# Patient Record
Sex: Male | Born: 1935 | Race: White | Hispanic: No | Marital: Married | Smoking: Never smoker
Health system: Southern US, Community
[De-identification: ages and names within clinical notes are randomized; demographics above are authoritative.]

---

## 2009-02-12 ENCOUNTER — Encounter: Admission: RE | Admit: 2009-02-12 | Discharge: 2009-02-12 | Payer: Self-pay | Admitting: Chiropractic Medicine

## 2010-04-27 IMAGING — CR DG KNEE COMPLETE 4+V*L*
5 series · 5 of 5 positions shown · non-contrast
Comparison: None

CLINICAL DATA: Pain, no recent injury

LEFT KNEE - COMPLETE 4+ VIEW

[view not recorded (1 of 5)]
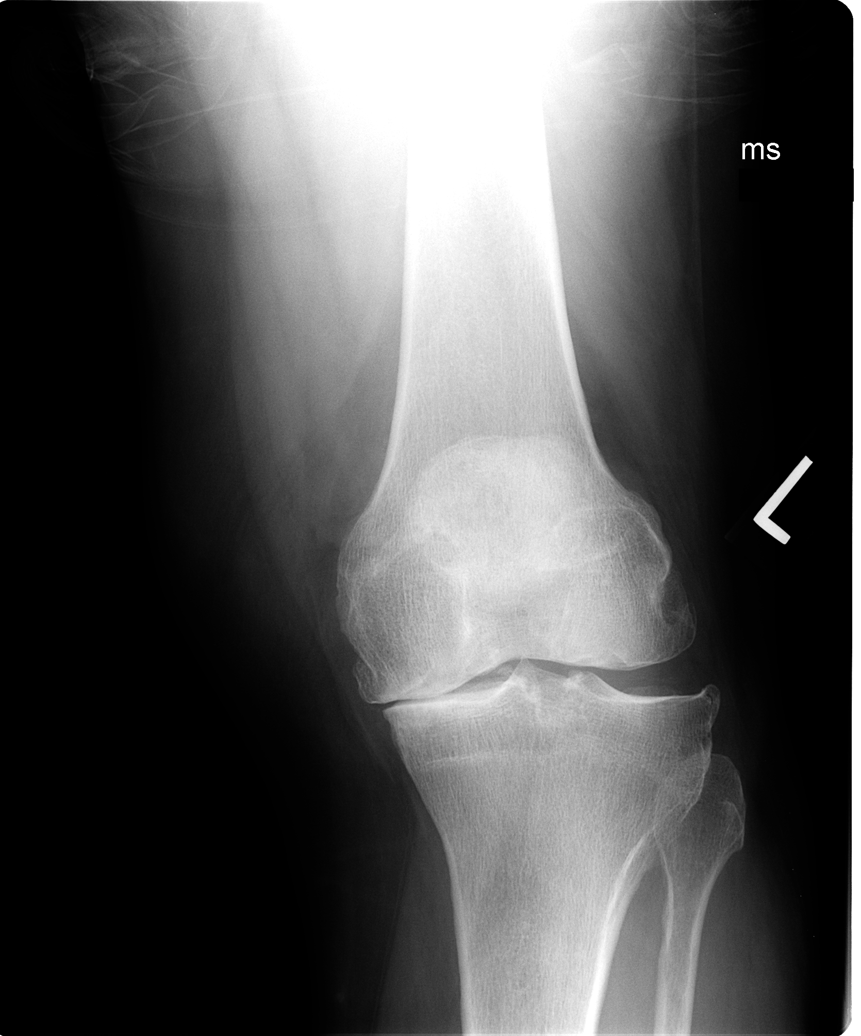

[view not recorded (2 of 5)]
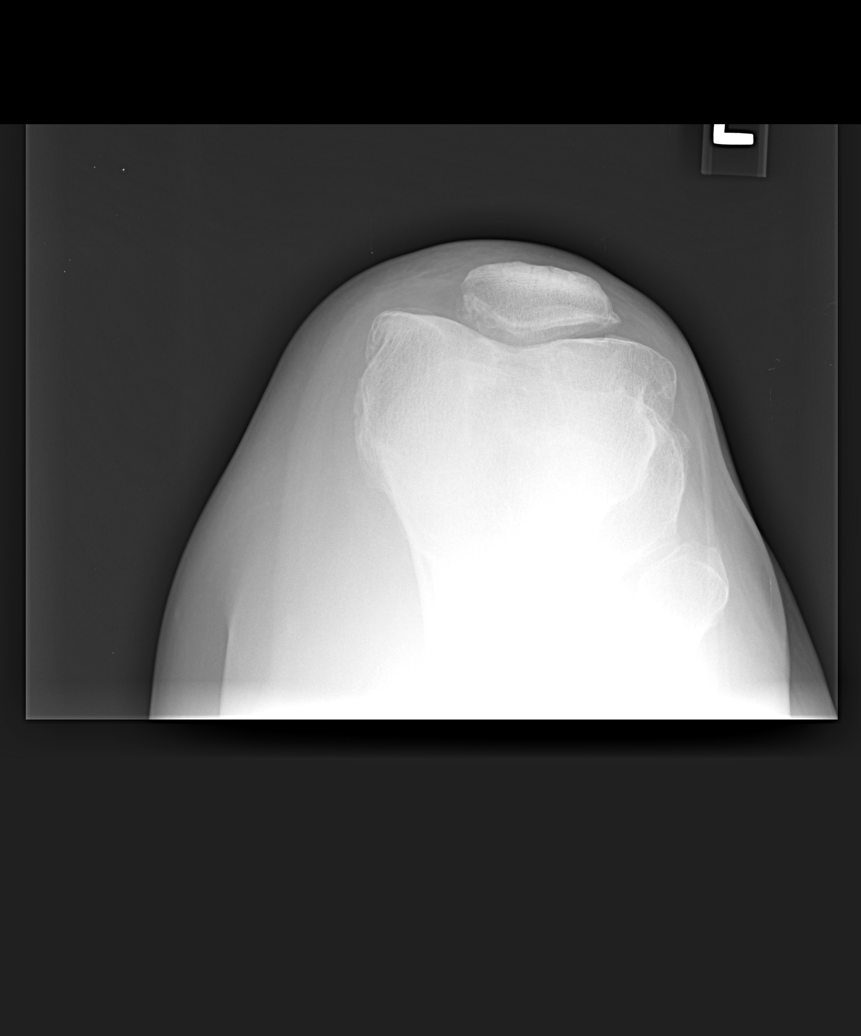

[view not recorded (3 of 5)]
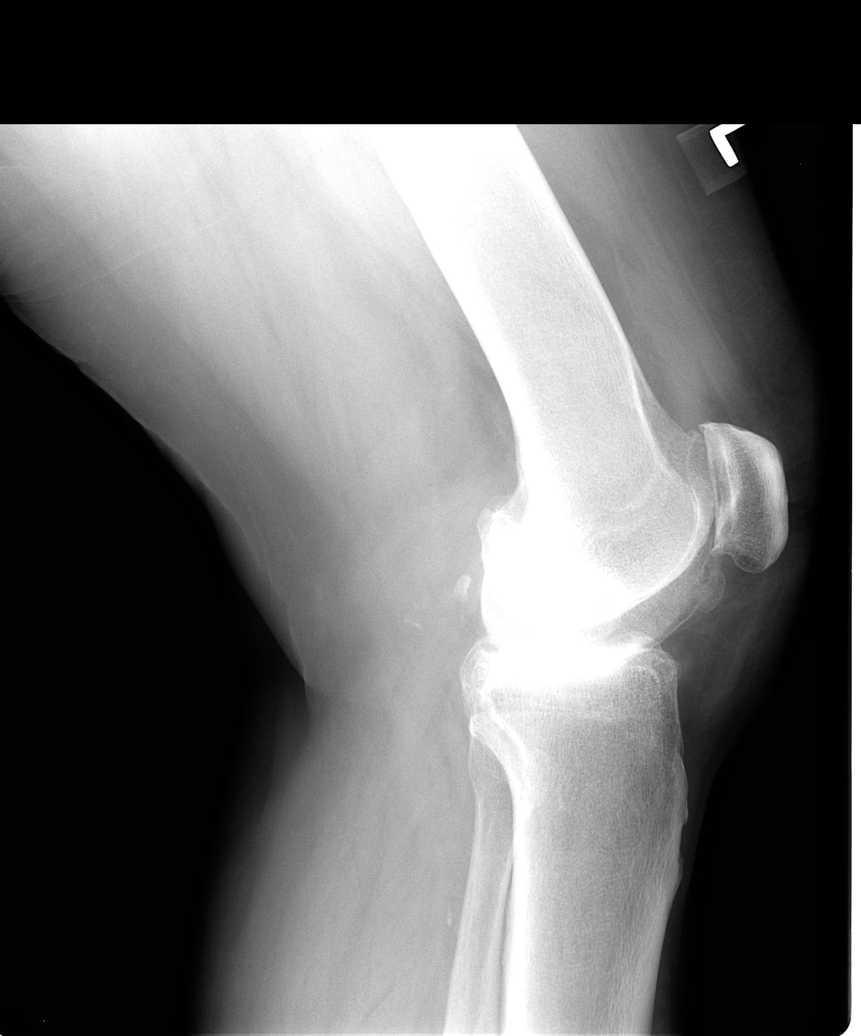

[view not recorded (4 of 5)]
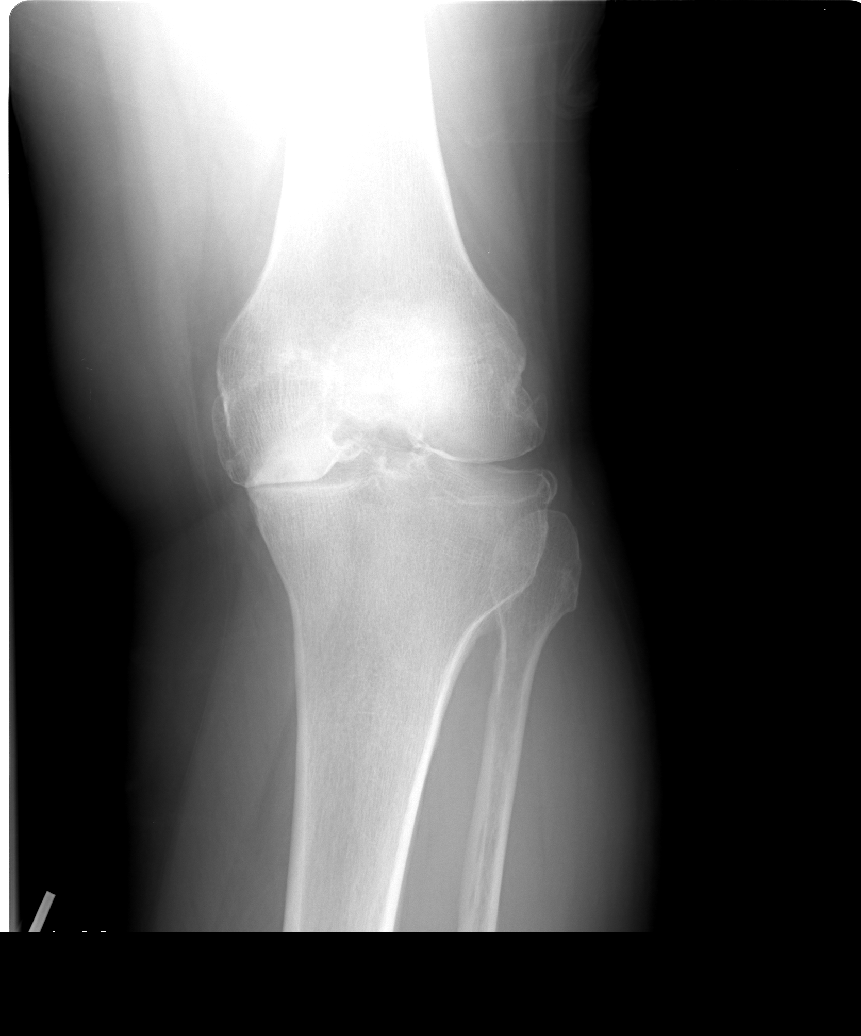

[view not recorded (5 of 5)]
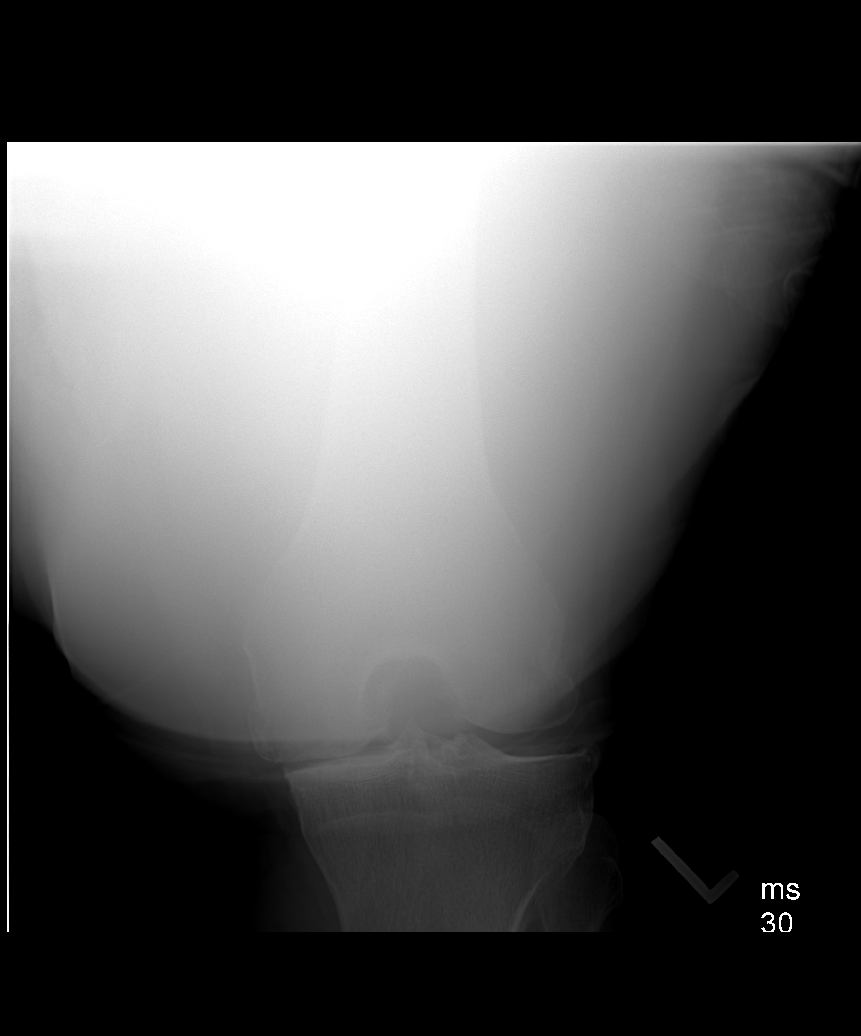

[5 of 5 positions shown; findings below may reference images not displayed]

FINDINGS: There is considerable loss of medial joint space with
sclerosis and spurring.  There also appears to be a small left knee
joint effusion present.  The patella is normally positioned with
the within the patellofemoral groove. The frontal view there may be
a small loose body just medial to the medial tibial spine.
IMPRESSION: Considerable degenerative change medially.  Small effusion.  Cannot
exclude small loose body.

## 2012-11-02 ENCOUNTER — Ambulatory Visit (INDEPENDENT_AMBULATORY_CARE_PROVIDER_SITE_OTHER): Payer: Self-pay | Admitting: Sports Medicine

## 2012-11-02 ENCOUNTER — Encounter: Payer: Self-pay | Admitting: Sports Medicine

## 2012-11-02 VITALS — BP 146/74 | HR 54 | Wt 203.0 lb

## 2012-11-02 DIAGNOSIS — M1710 Unilateral primary osteoarthritis, unspecified knee: Secondary | ICD-10-CM

## 2012-11-02 DIAGNOSIS — IMO0002 Reserved for concepts with insufficient information to code with codable children: Secondary | ICD-10-CM

## 2012-11-02 DIAGNOSIS — M1712 Unilateral primary osteoarthritis, left knee: Secondary | ICD-10-CM

## 2012-11-02 DIAGNOSIS — M170 Bilateral primary osteoarthritis of knee: Secondary | ICD-10-CM | POA: Insufficient documentation

## 2012-11-02 NOTE — Progress Notes (Signed)
  Subjective:    CC: Left knee pain  HPI:  This is a very pleasant 77 year old male who has a known history of left knee osteoarthritis, last injection was approximately 5 years ago and gave her tremendous response. I treated his wife with knee injections and she's doing very well, he desires interventional treatment. Pain is localized to the medial joint line, moderate, persistent, does not radiate, no mechanical symptoms.  Past medical history, Surgical history, Family history not pertinant except as noted below, Social history, Allergies, and medications have been entered into the medical record, reviewed, and no changes needed.   Review of Systems: No headache, visual changes, nausea, vomiting, diarrhea, constipation, dizziness, abdominal pain, skin rash, fevers, chills, night sweats, swollen lymph nodes, weight loss, chest pain, body aches, joint swelling, muscle aches, shortness of breath, mood changes, visual or auditory hallucinations.  Objective:    General: Well Developed, well nourished, and in no acute distress.  Neuro: Alert and oriented x3, extra-ocular muscles intact, sensation grossly intact.  HEENT: Normocephalic, atraumatic, pupils equal round reactive to light, neck supple, no masses, no lymphadenopathy, thyroid nonpalpable.  Skin: Warm and dry, no rashes noted.  Cardiac: Regular rate and rhythm, no murmurs rubs or gallops.  Respiratory: Clear to auscultation bilaterally. Not using accessory muscles, speaking in full sentences.  Abdominal: Soft, nontender, nondistended, positive bowel sounds, no masses, no organomegaly.  Left Knee: Normal to inspection with no erythema or effusion or obvious bony abnormalities. Tender to palpation over medial joint line ROM full in flexion and extension and lower leg rotation. Ligaments with solid consistent endpoints including ACL, PCL, LCL, MCL. Negative Mcmurray's, Apley's, and Thessalonian tests. Non painful patellar  compression. Patellar glide without crepitus. Patellar and quadriceps tendons unremarkable. Hamstring and quadriceps strength is normal.   Procedure: Real-time Ultrasound Guided Injection of left knee Device: GE Logiq E  Verbal informed consent obtained.  Time-out conducted.  Noted no overlying erythema, induration, or other signs of local infection.  Skin prepped in a sterile fashion.  Local anesthesia: Topical Ethyl chloride.  With sterile technique and under real time ultrasound guidance:  2 cc Kenalog 40, 4 cc lidocaine injected easily into the suprapatellar recess. Completed without difficulty  Pain immediately resolved suggesting accurate placement of the medication.  Advised to call if fevers/chills, erythema, induration, drainage, or persistent bleeding.  Images permanently stored and available for review in the ultrasound unit.  Impression: Technically successful ultrasound guided injection.  Impression and Recommendations:    The patient was counselled, risk factors were discussed, anticipatory guidance given.

## 2012-11-02 NOTE — Assessment & Plan Note (Signed)
Fantastic response to in particular injection 5 years ago. Repeat injection today. He will do home exercises with his wife, she has the same pathology.

## 2012-11-23 ENCOUNTER — Ambulatory Visit (INDEPENDENT_AMBULATORY_CARE_PROVIDER_SITE_OTHER): Payer: Medicare Other | Admitting: Sports Medicine

## 2012-11-23 ENCOUNTER — Ambulatory Visit: Payer: Medicare Other | Admitting: Family Medicine

## 2012-11-23 ENCOUNTER — Encounter: Payer: Self-pay | Admitting: Sports Medicine

## 2012-11-23 VITALS — BP 120/72 | HR 61 | Wt 206.0 lb

## 2012-11-23 DIAGNOSIS — M1710 Unilateral primary osteoarthritis, unspecified knee: Secondary | ICD-10-CM

## 2012-11-23 DIAGNOSIS — IMO0002 Reserved for concepts with insufficient information to code with codable children: Secondary | ICD-10-CM

## 2012-11-23 DIAGNOSIS — M1712 Unilateral primary osteoarthritis, left knee: Secondary | ICD-10-CM

## 2012-11-23 MED ORDER — CELECOXIB 200 MG PO CAPS: ORAL_CAPSULE | ORAL | Status: DC

## 2012-11-23 NOTE — Assessment & Plan Note (Signed)
Only one day of improvement with the last injection. Repeat steroid injection. Celebrex. 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 Jodi Geralds for consideration of total knee arthroplasty. of severe osteoarthritis.

## 2012-11-23 NOTE — Progress Notes (Signed)
  Subjective:    CC: Followup  HPI: Left knee osteoarthritis:  Had a great response approximately 6 years ago to intra-articular injection, one month ago I performed an ultrasound guided intra-articular injection which provided him with only one day of response, he returns today with persistent pain he localizes along the medial joint line, without any mechanical symptoms, and only minimal swelling. He desires repeat injection today, and he's not yet using any oral analgesics. Pain is moderate, persistent. No radiation. Worse with weightbearing.  Past medical history, Surgical history, Family history not pertinant except as noted below, Social history, Allergies, and medications have been entered into the medical record, reviewed, and no changes needed.   Review of Systems: No fevers, chills, night sweats, weight loss, chest pain, or shortness of breath.   Objective:    General: Well Developed, well nourished, and in no acute distress.  Neuro: Alert and oriented x3, extra-ocular muscles intact, sensation grossly intact.  HEENT: Normocephalic, atraumatic, pupils equal round reactive to light, neck supple, no masses, no lymphadenopathy, thyroid nonpalpable.  Skin: Warm and dry, no rashes. Cardiac: Regular rate and rhythm, no murmurs rubs or gallops, no lower extremity edema.  Respiratory: Clear to auscultation bilaterally. Not using accessory muscles, speaking in full sentences. Left knee: No effusion, visible scar from prior surgery. Tender to palpation along the medial joint line with palpable lateral subluxation of the tibia on the femur.  Procedure:  Injection of left knee Consent obtained and verified. Time-out conducted. Noted no overlying erythema, induration, or other signs of local infection. Skin prepped in a sterile fashion. Topical analgesic spray: Ethyl chloride. Completed without difficulty. Meds: 25-gauge needle advanced just inferior and medial to the inferior patellar pole,  2 cc Kenalog 40, 4 cc lidocaine injected easily. Pain immediately improved suggesting accurate placement of the medication. Advised to call if fevers/chills, erythema, induration, drainage, or persistent bleeding.  Impression and Recommendations:

## 2012-12-21 ENCOUNTER — Ambulatory Visit: Payer: Medicare Other | Admitting: Sports Medicine

## 2013-03-21 ENCOUNTER — Encounter: Payer: Self-pay | Admitting: Sports Medicine

## 2013-03-21 ENCOUNTER — Ambulatory Visit (INDEPENDENT_AMBULATORY_CARE_PROVIDER_SITE_OTHER): Payer: Medicare Other | Admitting: Sports Medicine

## 2013-03-21 VITALS — BP 148/74 | HR 57 | Wt 213.0 lb

## 2013-03-21 DIAGNOSIS — M1710 Unilateral primary osteoarthritis, unspecified knee: Secondary | ICD-10-CM

## 2013-03-21 DIAGNOSIS — IMO0002 Reserved for concepts with insufficient information to code with codable children: Secondary | ICD-10-CM

## 2013-03-21 DIAGNOSIS — M1712 Unilateral primary osteoarthritis, left knee: Secondary | ICD-10-CM

## 2013-03-21 NOTE — Progress Notes (Signed)
  Subjective:    CC: Left knee pain  HPI: Osteoarthritis of the left knee: I injected this pleasant 77 year old male is me approximately 4 months ago, he had a good response until approximately 2-3 weeks ago, now he has worsening pain in both joint lines and over the, moderate, persistent, worse with weightbearing. He desires further interventional treatment.  Past medical history, Surgical history, Family history not pertinant except as noted below, Social history, Allergies, and medications have been entered into the medical record, reviewed, and no changes needed.   Review of Systems: No fevers, chills, night sweats, weight loss, chest pain, or shortness of breath.   Objective:    General: Well Developed, well nourished, and in no acute distress.  Neuro: Alert and oriented x3, extra-ocular muscles intact, sensation grossly intact.  HEENT: Normocephalic, atraumatic, pupils equal round reactive to light, neck supple, no masses, no lymphadenopathy, thyroid nonpalpable.  Skin: Warm and dry, no rashes. Cardiac: Regular rate and rhythm, no murmurs rubs or gallops, no lower extremity edema.  Respiratory: Clear to auscultation bilaterally. Not using accessory muscles, speaking in full sentences. Left Knee: Normal to inspection with no erythema or effusion or obvious bony abnormalities. Tender to palpation at the joint lines. ROM full in flexion and extension and lower leg rotation. Ligaments with solid consistent endpoints including ACL, PCL, LCL, MCL. Negative Mcmurray's, Apley's, and Thessalonian tests. Non painful patellar compression. Patellar glide without crepitus. Patellar and quadriceps tendons unremarkable. Hamstring and quadriceps strength is normal.   Procedure: Real-time Ultrasound Guided Injection of left knee Device: GE Logiq E  Verbal informed consent obtained.  Time-out conducted.  Noted no overlying erythema, induration, or other signs of local infection.  Skin prepped  in a sterile fashion.  Local anesthesia: Topical Ethyl chloride.  With sterile technique and under real time ultrasound guidance:  2 cc Kenalog 40, 4 cc lidocaine injected easily into the suprapatellar recess. Completed without difficulty  Pain immediately resolved suggesting accurate placement of the medication.  Advised to call if fevers/chills, erythema, induration, drainage, or persistent bleeding.  Images permanently stored and available for review in the ultrasound unit.  Impression: Technically successful ultrasound guided injection. Impression and Recommendations:

## 2013-03-21 NOTE — Assessment & Plan Note (Signed)
Reinjected left knee, last injection lasted just over 3 months. Return as needed, I do see him proceeding to Visco supplementation the near future.

## 2013-06-24 ENCOUNTER — Ambulatory Visit (INDEPENDENT_AMBULATORY_CARE_PROVIDER_SITE_OTHER): Payer: Medicare Other | Admitting: Sports Medicine

## 2013-06-24 ENCOUNTER — Encounter: Payer: Self-pay | Admitting: Sports Medicine

## 2013-06-24 VITALS — BP 145/67 | HR 58 | Ht 70.0 in | Wt 219.0 lb

## 2013-06-24 DIAGNOSIS — M1712 Unilateral primary osteoarthritis, left knee: Secondary | ICD-10-CM

## 2013-06-24 DIAGNOSIS — IMO0002 Reserved for concepts with insufficient information to code with codable children: Secondary | ICD-10-CM

## 2013-06-24 DIAGNOSIS — M1710 Unilateral primary osteoarthritis, unspecified knee: Secondary | ICD-10-CM

## 2013-06-24 NOTE — Progress Notes (Signed)
  Subjective:    CC: Knee pain  HPI: This very pleasant 78 year old male with knee osteoarthritis comes back, his last injection was 3 months ago and worked [employer]. He now has a recurrence of pain at the joint line, moderate, persistent without radiation.  Past medical history, Surgical history, Family history not pertinant except as noted below, Social history, Allergies, and medications have been entered into the medical record, reviewed, and no changes needed.   Review of Systems: No fevers, chills, night sweats, weight loss, chest pain, or shortness of breath.   Objective:    General: Well Developed, well nourished, and in no acute distress.  Neuro: Alert and oriented x3, extra-ocular muscles intact, sensation grossly intact.  HEENT: Normocephalic, atraumatic, pupils equal round reactive to light, neck supple, no masses, no lymphadenopathy, thyroid nonpalpable.  Skin: Warm and dry, no rashes. Cardiac: Regular rate and rhythm, no murmurs rubs or gallops, no lower extremity edema.  Respiratory: Clear to auscultation bilaterally. Not using accessory muscles, speaking in full sentences.  Procedure: Real-time Ultrasound Guided Injection of left knee Device: GE Logiq E  Verbal informed consent obtained.  Time-out conducted.  Noted no overlying erythema, induration, or other signs of local infection.  Skin prepped in a sterile fashion.  Local anesthesia: Topical Ethyl chloride.  With sterile technique and under real time ultrasound guidance:  2 cc Kenalog 40, 4 cc lidocaine injected easily into the suprapatellar recess. Completed without difficulty  Pain immediately resolved suggesting accurate placement of the medication.  Advised to call if fevers/chills, erythema, induration, drainage, or persistent bleeding.  Images permanently stored and available for review in the ultrasound unit.  Impression: Technically successful ultrasound guided injection.  Impression and Recommendations:

## 2013-06-24 NOTE — Assessment & Plan Note (Signed)
Last injection was 3 months ago, repeat left knee injection as above. Return as needed, if he does not get a solid three-month response we will be switching to Visco supplementation.

## 2013-09-25 ENCOUNTER — Ambulatory Visit: Payer: Medicare Other | Admitting: Sports Medicine

## 2013-09-26 ENCOUNTER — Encounter: Payer: Self-pay | Admitting: Sports Medicine

## 2013-09-26 ENCOUNTER — Ambulatory Visit (INDEPENDENT_AMBULATORY_CARE_PROVIDER_SITE_OTHER): Payer: Medicare Other | Admitting: Sports Medicine

## 2013-09-26 VITALS — BP 148/62 | HR 63 | Ht 70.0 in | Wt 218.0 lb

## 2013-09-26 DIAGNOSIS — M1710 Unilateral primary osteoarthritis, unspecified knee: Secondary | ICD-10-CM

## 2013-09-26 DIAGNOSIS — M1712 Unilateral primary osteoarthritis, left knee: Secondary | ICD-10-CM

## 2013-09-26 DIAGNOSIS — IMO0002 Reserved for concepts with insufficient information to code with codable children: Secondary | ICD-10-CM

## 2013-09-26 NOTE — Progress Notes (Signed)
    Subjective:    CC: Knee pain  HPI: Knee osteoarthritis: The right side is pain-free, left side continues to have pain, last injection was 3 months ago, pain has returned at the joint lines, moderate, persistent without radiation.  Past medical history, Surgical history, Family history not pertinant except as noted below, Social history, Allergies, and medications have been entered into the medical record, reviewed, and no changes needed.   Review of Systems: No fevers, chills, night sweats, weight loss, chest pain, or shortness of breath.   Objective:    General: Well Developed, well nourished, and in no acute distress.  Neuro: Alert and oriented x3, extra-ocular muscles intact, sensation grossly intact.  HEENT: Normocephalic, atraumatic, pupils equal round reactive to light, neck supple, no masses, no lymphadenopathy, thyroid nonpalpable.  Skin: Warm and dry, no rashes. Cardiac: Regular rate and rhythm, no murmurs rubs or gallops, no lower extremity edema. Respiratory: Clear to auscultation bilaterally. Not using accessory muscles, speaking in full sentences.  Procedure: Real-time Ultrasound Guided Injection of left knee Device: GE Logiq E  Verbal informed consent obtained.  Time-out conducted.  Noted no overlying erythema, induration, or other signs of local infection.  Skin prepped in a sterile fashion.  Local anesthesia: Topical Ethyl chloride.  With sterile technique and under real time ultrasound guidance:  2 cc Kenalog 40, cc lidocaine injected easily into the suprapatellar recess. Completed without difficulty  Pain immediately resolved suggesting accurate placement of the medication.  Advised to call if fevers/chills, erythema, induration, drainage, or persistent bleeding.  Images permanently stored and available for review in the ultrasound unit.  Impression: Technically successful ultrasound guided injection.  Impression and Recommendations:

## 2013-09-26 NOTE — Assessment & Plan Note (Signed)
Left knee injection lasted approximately 3 months and he desires a repeat. He does not get desire to start viscous supplementation. Steroid injection as above. Return as needed.

## 2013-12-27 ENCOUNTER — Ambulatory Visit (INDEPENDENT_AMBULATORY_CARE_PROVIDER_SITE_OTHER): Payer: Medicare Other | Admitting: Sports Medicine

## 2013-12-27 ENCOUNTER — Encounter: Payer: Self-pay | Admitting: Sports Medicine

## 2013-12-27 VITALS — BP 141/79 | HR 60 | Ht 69.0 in | Wt 211.0 lb

## 2013-12-27 DIAGNOSIS — M1712 Unilateral primary osteoarthritis, left knee: Secondary | ICD-10-CM

## 2013-12-27 DIAGNOSIS — M1710 Unilateral primary osteoarthritis, unspecified knee: Secondary | ICD-10-CM

## 2013-12-27 MED ORDER — NAPROXEN-ESOMEPRAZOLE 500-20 MG PO TBEC: 1.0000 | DELAYED_RELEASE_TABLET | Freq: Two times a day (BID) | ORAL | Status: DC

## 2013-12-27 NOTE — Assessment & Plan Note (Addendum)
Three-month response to the last injection, repeat left knee aspiration/injection today. Adding Vimovo as diclofenac hurt his stomach. Return as needed.

## 2013-12-27 NOTE — Progress Notes (Signed)
  Subjective:    CC: Knee pain  HPI: This is a very pleasant 78 year old male with left knee osteoarthritis, his last injection was 3 months ago, and lasted exactly 3 months, he desires repeat injection, pain is moderate, persistent and localized at the medial joint line.  Past medical history, Surgical history, Family history not pertinant except as noted below, Social history, Allergies, and medications have been entered into the medical record, reviewed, and no changes needed.   Review of Systems: No fevers, chills, night sweats, weight loss, chest pain, or shortness of breath.   Objective:    General: Well Developed, well nourished, and in no acute distress.  Neuro: Alert and oriented x3, extra-ocular muscles intact, sensation grossly intact.  HEENT: Normocephalic, atraumatic, pupils equal round reactive to light, neck supple, no masses, no lymphadenopathy, thyroid nonpalpable.  Skin: Warm and dry, no rashes. Cardiac: Regular rate and rhythm, no murmurs rubs or gallops, no lower extremity edema.  Respiratory: Clear to auscultation bilaterally. Not using accessory muscles, speaking in full sentences.  Procedure: Real-time Ultrasound Guided Injection of left knee Device: GE Logiq E  Verbal informed consent obtained.  Time-out conducted.  Noted no overlying erythema, induration, or other signs of local infection.  Skin prepped in a sterile fashion.  Local anesthesia: Topical Ethyl chloride.  With sterile technique and under real time ultrasound guidance:  18-gauge needle used to aspirate approximately 25 cc of clear, straw-colored fluid, syringe switched and 2 cc Kenalog 40, for cc lidocaine injected easily into the suprapatellar recess. Completed without difficulty  Pain immediately resolved suggesting accurate placement of the medication.  Advised to call if fevers/chills, erythema, induration, drainage, or persistent bleeding.  Images permanently stored and available for review in  the ultrasound unit.  Impression: Technically successful ultrasound guided injection.  Impression and Recommendations:

## 2014-03-28 ENCOUNTER — Encounter: Payer: Self-pay | Admitting: Sports Medicine

## 2014-03-28 ENCOUNTER — Ambulatory Visit (INDEPENDENT_AMBULATORY_CARE_PROVIDER_SITE_OTHER): Payer: Medicare Other | Admitting: Sports Medicine

## 2014-03-28 VITALS — BP 148/66 | HR 91 | Wt 220.0 lb

## 2014-03-28 DIAGNOSIS — J0100 Acute maxillary sinusitis, unspecified: Secondary | ICD-10-CM | POA: Insufficient documentation

## 2014-03-28 DIAGNOSIS — J0101 Acute recurrent maxillary sinusitis: Secondary | ICD-10-CM

## 2014-03-28 DIAGNOSIS — M1712 Unilateral primary osteoarthritis, left knee: Secondary | ICD-10-CM

## 2014-03-28 MED ORDER — AZITHROMYCIN 250 MG PO TABS: ORAL_TABLET | ORAL | Status: DC

## 2014-03-28 MED ORDER — TRAMADOL HCL 50 MG PO TABS: 50.0000 mg | ORAL_TABLET | Freq: Two times a day (BID) | ORAL | Status: DC

## 2014-03-28 NOTE — Assessment & Plan Note (Signed)
Azithromycin, follow-up with PCP regarding this.

## 2014-03-28 NOTE — Progress Notes (Signed)
  Subjective:    CC: Left knee pain  HPI: This been 3 months since Last Aspiration and Injection, He Continues to Be Resistant to Trying Visco Supplementation. He Has a Recurrence of Pain, Moderate, Persistent without Radiation. Localized at the Joint Lines.  Also having pain over the maxillary sinuses with purulent nasal discharge, moderate, persistent.  Past medical history, Surgical history, Family history not pertinant except as noted below, Social history, Allergies, and medications have been entered into the medical record, reviewed, and no changes needed.   Review of Systems: No fevers, chills, night sweats, weight loss, chest pain, or shortness of breath.   Objective:    General: Well Developed, well nourished, and in no acute distress.  Neuro: Alert and oriented x3, extra-ocular muscles intact, sensation grossly intact.  HEENT: Normocephalic, atraumatic, pupils equal round reactive to light, neck supple, no masses, no lymphadenopathy, thyroid nonpalpable. Oropharynx, nasopharynx, external ear canals are unremarkable. Skin: Warm and dry, no rashes. Cardiac: Regular rate and rhythm, no murmurs rubs or gallops, no lower extremity edema.  Respiratory: Clear to auscultation bilaterally. Not using accessory muscles, speaking in full sentences. Left Knee: Visible and palpable effusion with a fluid wave and tenderness at the joint lines. ROM normal in flexion and extension and lower leg rotation. Ligaments with solid consistent endpoints including ACL, PCL, LCL, MCL. Negative Mcmurray's and provocative meniscal tests. Non painful patellar compression. Patellar and quadriceps tendons unremarkable. Hamstring and quadriceps strength is normal.  Procedure: Real-time Ultrasound Guided aspiration/Injection of left knee Device: GE Logiq E  Verbal informed consent obtained.  Time-out conducted.  Noted no overlying erythema, induration, or other signs of local infection.  Skin prepped in a  sterile fashion.  Local anesthesia: Topical Ethyl chloride.  With sterile technique and under real time ultrasound guidance: 20 mL of straw-colored fluid aspirated, syringe switched and 2 mL kenalog 40, 4 mL lidocaine injected easily.  Completed without difficulty  Pain immediately resolved suggesting accurate placement of the medication.  Advised to call if fevers/chills, erythema, induration, drainage, or persistent bleeding.  Images permanently stored and available for review in the ultrasound unit.  Impression: Technically successful ultrasound guided injection.  Impression and Recommendations:

## 2014-03-28 NOTE — Assessment & Plan Note (Signed)
Injections are lasting a solid 3 months. Repeat aspiration and injection as above. Tramadol. Return as needed, I did give him information on viscous supplementation.

## 2014-06-27 ENCOUNTER — Encounter: Payer: Self-pay | Admitting: Sports Medicine

## 2014-06-27 ENCOUNTER — Ambulatory Visit (INDEPENDENT_AMBULATORY_CARE_PROVIDER_SITE_OTHER): Payer: Medicare Other | Admitting: Sports Medicine

## 2014-06-27 VITALS — BP 141/59 | HR 62 | Wt 221.0 lb

## 2014-06-27 DIAGNOSIS — M1712 Unilateral primary osteoarthritis, left knee: Secondary | ICD-10-CM

## 2014-06-27 MED ORDER — TRAMADOL HCL 50 MG PO TABS: 50.0000 mg | ORAL_TABLET | Freq: Two times a day (BID) | ORAL | Status: DC

## 2014-06-27 NOTE — Assessment & Plan Note (Signed)
Aspiration and injection as above, patient tells me that they are lasting exactly 3 months which is appropriate. Return as needed, likely 3 months, we again discussed Visco supplementation, and he is not yet interested.

## 2014-06-27 NOTE — Progress Notes (Signed)
  Subjective:    CC: Left knee pain  HPI: This is a pleasant 79 year old male with a known history of left knee osteoarthritis, he has done extremely well with occasional aspirations and injections approximately every 3-6 months. Pain is moderate, persistent with an effusion, he does desire repeat interventional treatment. Pain is localized at the joint lines, no radiation, no mechanical symptoms, no constitutional symptoms.  Past medical history, Surgical history, Family history not pertinant except as noted below, Social history, Allergies, and medications have been entered into the medical record, reviewed, and no changes needed.   Review of Systems: No fevers, chills, night sweats, weight loss, chest pain, or shortness of breath.   Objective:    General: Well Developed, well nourished, and in no acute distress.  Neuro: Alert and oriented x3, extra-ocular muscles intact, sensation grossly intact.  HEENT: Normocephalic, atraumatic, pupils equal round reactive to light, neck supple, no masses, no lymphadenopathy, thyroid nonpalpable.  Skin: Warm and dry, no rashes. Cardiac: Regular rate and rhythm, no murmurs rubs or gallops, no lower extremity edema.  Respiratory: Clear to auscultation bilaterally. Not using accessory muscles, speaking in full sentences. Left Knee: Visible and palpable effusion with a fluid wave and tenderness at the medial joint line ROM normal in flexion and extension and lower leg rotation. Ligaments with solid consistent endpoints including ACL, PCL, LCL, MCL. Negative Mcmurray's and provocative meniscal tests. Non painful patellar compression. Patellar and quadriceps tendons unremarkable. Hamstring and quadriceps strength is normal.  Procedure: Real-time Ultrasound Guided aspiration/Injection of left knee Device: GE Logiq E  Verbal informed consent obtained.  Time-out conducted.  Noted no overlying erythema, induration, or other signs of local infection.  Skin  prepped in a sterile fashion.  Local anesthesia: Topical Ethyl chloride.  With sterile technique and under real time ultrasound guidance: Aspirated 18 mL of straw-colored fluid, syringe switched and 2 mL Kenalog 40, 4 mL lidocaine injected easily.  Completed without difficulty  Pain immediately resolved suggesting accurate placement of the medication.  Advised to call if fevers/chills, erythema, induration, drainage, or persistent bleeding.  Images permanently stored and available for review in the ultrasound unit.  Impression: Technically successful ultrasound guided injection.  Impression and Recommendations:

## 2014-09-26 ENCOUNTER — Ambulatory Visit: Payer: Medicare Other | Admitting: Sports Medicine

## 2016-04-26 ENCOUNTER — Encounter: Payer: Self-pay | Admitting: Sports Medicine

## 2016-04-26 ENCOUNTER — Ambulatory Visit (INDEPENDENT_AMBULATORY_CARE_PROVIDER_SITE_OTHER): Payer: Medicare Other | Admitting: Sports Medicine

## 2016-04-26 DIAGNOSIS — M170 Bilateral primary osteoarthritis of knee: Secondary | ICD-10-CM

## 2016-04-26 MED ORDER — HYDROCODONE-ACETAMINOPHEN 10-325 MG PO TABS: 1.0000 | ORAL_TABLET | Freq: Three times a day (TID) | ORAL | 0 refills | Status: DC | PRN

## 2016-04-26 NOTE — Progress Notes (Signed)
  Subjective:    CC: bilateral knee pain  HPI: This is a pleasant 81 year old male, a year and a half week drained and injected 1 of his knees, he did extremely well and is now having a recurrence of pain, pain is moderate, persistent with swelling, localized at the joint lines. He desires repeat interventional treatment today.  Past medical history:  Negative.  See flowsheet/record as well for more information.  Surgical history: Negative.  See flowsheet/record as well for more information.  Family history: Negative.  See flowsheet/record as well for more information.  Social history: Negative.  See flowsheet/record as well for more information.  Allergies, and medications have been entered into the medical record, reviewed, and no changes needed.   Review of Systems: No fevers, chills, night sweats, weight loss, chest pain, or shortness of breath.   Objective:    General: Well Developed, well nourished, and in no acute distress.  Neuro: Alert and oriented x3, extra-ocular muscles intact, sensation grossly intact.  HEENT: Normocephalic, atraumatic, pupils equal round reactive to light, neck supple, no masses, no lymphadenopathy, thyroid nonpalpable.  Skin: Warm and dry, no rashes. Cardiac: Regular rate and rhythm, no murmurs rubs or gallops, no lower extremity edema.  Respiratory: Clear to auscultation bilaterally. Not using accessory muscles, speaking in full sentences. Bilateral knees: Swollen, right worse than left with tenderness at the medial joint lines on both knees. ROM normal in flexion and extension and lower leg rotation. Ligaments with solid consistent endpoints including ACL, PCL, LCL, MCL. Negative Mcmurray's and provocative meniscal tests. Non painful patellar compression. Patellar and quadriceps tendons unremarkable. Hamstring and quadriceps strength is normal.  Procedure: Real-time Ultrasound Guided aspiration/injection of right knee Device: GE Logiq E  Verbal  informed consent obtained.  Time-out conducted.  Noted no overlying erythema, induration, or other signs of local infection.  Skin prepped in a sterile fashion.  Local anesthesia: Topical Ethyl chloride.  With sterile technique and under real time ultrasound guidance:  Aspirated 33 mL straw-colored fluid, syringe switched and 1 mL kenalog 40, 2 mL lidocaine, 2 mL Marcaine injected easily. Completed without difficulty  Pain immediately resolved suggesting accurate placement of the medication.  Advised to call if fevers/chills, erythema, induration, drainage, or persistent bleeding.  Images permanently stored and available for review in the ultrasound unit.  Impression: Technically successful ultrasound guided injection.  Procedure: Real-time Ultrasound Guided aspiration/injection of left knee Device: GE Logiq E  Verbal informed consent obtained.  Time-out conducted.  Noted no overlying erythema, induration, or other signs of local infection.  Skin prepped in a sterile fashion.  Local anesthesia: Topical Ethyl chloride.  With sterile technique and under real time ultrasound guidance:  Aspirated 15 mL straw-colored fluid, syringe switched and 1 mL kenalog 40, 2 mL lidocaine, 2 mL Marcaine injected easily. Completed without difficulty  Pain immediately resolved suggesting accurate placement of the medication.  Advised to call if fevers/chills, erythema, induration, drainage, or persistent bleeding.  Images permanently stored and available for review in the ultrasound unit.  Impression: Technically successful ultrasound guided injection.  Impression and Recommendations:    Primary osteoarthritis of both knees Bilateral knee aspiration and injection as above. Return to see me as needed. Giving him a bit of pain medication to use in the meantime.

## 2016-04-26 NOTE — Assessment & Plan Note (Signed)
Bilateral knee aspiration and injection as above. Return to see me as needed. Giving him a bit of pain medication to use in the meantime.

## 2016-05-10 ENCOUNTER — Telehealth: Payer: Self-pay | Admitting: Sports Medicine

## 2016-05-10 NOTE — Telephone Encounter (Signed)
Go ahead and double book him at the 8:30 slot in the morning and have him get here 8:00.

## 2016-05-10 NOTE — Telephone Encounter (Signed)
Patient called request to know if you could work him in tomorrow around 11 am he is having knee trouble again and needs another injection possibly. Adv pt it was your 1/2 day and there are openings on Thursday around same time and he said has to take wife to eye surgeon tomorrow morning and tomorrow would really work better and that you told him to call and he could come anytime his knee started giving him trouble. Please advise. Thanks

## 2016-05-13 ENCOUNTER — Ambulatory Visit (INDEPENDENT_AMBULATORY_CARE_PROVIDER_SITE_OTHER): Payer: Medicare Other | Admitting: Sports Medicine

## 2016-05-13 ENCOUNTER — Encounter: Payer: Self-pay | Admitting: Sports Medicine

## 2016-05-13 ENCOUNTER — Telehealth: Payer: Self-pay | Admitting: Sports Medicine

## 2016-05-13 DIAGNOSIS — M170 Bilateral primary osteoarthritis of knee: Secondary | ICD-10-CM

## 2016-05-13 DIAGNOSIS — M2170 Unequal limb length (acquired), unspecified site: Secondary | ICD-10-CM | POA: Diagnosis not present

## 2016-05-13 NOTE — Telephone Encounter (Signed)
-----   Message from Monica Bectonhomas J Thekkekandam, MD sent at 05/13/2016  4:40 PM EST ----- Orthovisc approval right knee only please boogs. ___________________________________________ Ihor Austinhomas J. Benjamin Stainhekkekandam, M.D., ABFM., CAQSM. Primary Care and Sports Medicine [facility]  Adjunct Instructor of Family Medicine  [employer]

## 2016-05-13 NOTE — Assessment & Plan Note (Signed)
Heel lift placed in the left shoe.

## 2016-05-13 NOTE — Progress Notes (Signed)
  Subjective:    CC: Follow-up  HPI: Primary osteoarthritis of both knees: Left knee is now pain-free after injection, right knee developed a recurrence of pain with sharp pain at the medial joint line. Moderate, persistent without radiation, no overt mechanical symptoms.  Past medical history:  Negative.  See flowsheet/record as well for more information.  Surgical history: Negative.  See flowsheet/record as well for more information.  Family history: Negative.  See flowsheet/record as well for more information.  Social history: Negative.  See flowsheet/record as well for more information.  Allergies, and medications have been entered into the medical record, reviewed, and no changes needed.   Review of Systems: No fevers, chills, night sweats, weight loss, chest pain, or shortness of breath.   Objective:    General: Well Developed, well nourished, and in no acute distress.  Neuro: Alert and oriented x3, extra-ocular muscles intact, sensation grossly intact.  HEENT: Normocephalic, atraumatic, pupils equal round reactive to light, neck supple, no masses, no lymphadenopathy, thyroid nonpalpable.  Skin: Warm and dry, no rashes. Cardiac: Regular rate and rhythm, no murmurs rubs or gallops, no lower extremity edema.  Respiratory: Clear to auscultation bilaterally. Not using accessory muscles, speaking in full sentences. Right Knee: Minimally swollen with palpable tenderness at the medial joint line ROM normal in flexion and extension and lower leg rotation. Ligaments with solid consistent endpoints including ACL, PCL, LCL, MCL. Negative Mcmurray's and provocative meniscal tests. Non painful patellar compression. Patellar and quadriceps tendons unremarkable. Hamstring and quadriceps strength is normal.  Procedure: Real-time Ultrasound Guided aspiration/injection of right knee Device: GE Logiq E  Verbal informed consent obtained.  Time-out conducted.  Noted no overlying erythema,  induration, or other signs of local infection.  Skin prepped in a sterile fashion.  Local anesthesia: Topical Ethyl chloride.  With sterile technique and under real time ultrasound guidance:  Aspirated 18 mL straw-colored fluid, syringe switched and 1 mL kenalog 40, 2 mL lidocaine, 2 mL Marcaine injected easily. Completed without difficulty  Pain immediately resolved suggesting accurate placement of the medication.  Advised to call if fevers/chills, erythema, induration, drainage, or persistent bleeding.  Images permanently stored and available for review in the ultrasound unit.  Impression: Technically successful ultrasound guided injection.  Leg length discrepancy, left leg 2 cm shorter than the right  Impression and Recommendations:    Primary osteoarthritis of both knees Left knee is doing well, right knee needed a reinjection and aspiration. I'm going to get him set up for viscosupplementation, I would also like an MRI on the right knee.   Leg length discrepancy Heel lift placed in the left shoe.

## 2016-05-13 NOTE — Assessment & Plan Note (Signed)
Left knee is doing well, right knee needed a reinjection and aspiration. I'm going to get him set up for viscosupplementation, I would also like an MRI on the right knee.

## 2016-05-13 NOTE — Telephone Encounter (Signed)
Submitted for approval on Orthovisc. Awaiting confirmation.  

## 2016-05-18 NOTE — Telephone Encounter (Signed)
Received the following information from OV benefits investigation:   Patient has a [employer] Medicare Complete HMO plan with an effective date of 04/18/2016. R0413 is covered at 80% & [id number] is covered at 100% & [id number] is covered at 100% of the contracted rate when performed in an office setting. A Copay of $30.00 applies whether or not a Specialist office visit is billed. *If Out of Pocket is met, Coverage goes to 100% & Copay will be waived. Ref# [id number]  Pt advised of estimated OOP cost, Pt declined to get OV at this time.

## 2016-05-23 ENCOUNTER — Other Ambulatory Visit: Payer: Medicare Other

## 2016-06-10 ENCOUNTER — Ambulatory Visit: Payer: Medicare Other | Admitting: Sports Medicine

## 2016-08-02 ENCOUNTER — Ambulatory Visit (INDEPENDENT_AMBULATORY_CARE_PROVIDER_SITE_OTHER): Payer: Medicare Other | Admitting: Sports Medicine

## 2016-08-02 DIAGNOSIS — M170 Bilateral primary osteoarthritis of knee: Secondary | ICD-10-CM

## 2016-08-02 MED ORDER — CELECOXIB 200 MG PO CAPS: ORAL_CAPSULE | ORAL | 2 refills | Status: DC

## 2016-08-02 MED ORDER — OXYCODONE-ACETAMINOPHEN 5-325 MG PO TABS: 1.0000 | ORAL_TABLET | Freq: Two times a day (BID) | ORAL | 0 refills | Status: DC | PRN

## 2016-08-02 NOTE — Assessment & Plan Note (Addendum)
3 months since previous procedure, bilateral aspiration and injection as above. Patient cannot afford Orthovisc. Switching to Celebrex, and [facility]ast[facility]. Hydrocodone tens are ineffective, increasing to oxycodone.

## 2016-08-02 NOTE — Progress Notes (Signed)
  Subjective:    CC: Follow-up bilateral knee pain  HPI: This is a pleasant 81 year old male with known bilateral knee osteoarthritis, 3 months ago we aspirated and injected his knees, he is having recurrence of pain. Unfortunately the co-pay for Orthovisc was a bit too high. He desires repeat aspiration and injection, pain is moderate, persistent, localized at the joint lines without radiation, he does have severe swelling.  Past medical history:  Negative.  See flowsheet/record as well for more information.  Surgical history: Negative.  See flowsheet/record as well for more information.  Family history: Negative.  See flowsheet/record as well for more information.  Social history: Negative.  See flowsheet/record as well for more information.  Allergies, and medications have been entered into the medical record, reviewed, and no changes needed.   Review of Systems: No fevers, chills, night sweats, weight loss, chest pain, or shortness of breath.   Objective:    General: Well Developed, well nourished, and in no acute distress.  Neuro: Alert and oriented x3, extra-ocular muscles intact, sensation grossly intact.  HEENT: Normocephalic, atraumatic, pupils equal round reactive to light, neck supple, no masses, no lymphadenopathy, thyroid nonpalpable.  Skin: Warm and dry, no rashes. Cardiac: Regular rate and rhythm, no murmurs rubs or gallops, no lower extremity edema.  Respiratory: Clear to auscultation bilaterally. Not using accessory muscles, speaking in full sentences. Bilateral knees: Visibly swollen with fluid wave on both sides, and tenderness at the medial joint lines. ROM normal in flexion and extension and lower leg rotation. Ligaments with solid consistent endpoints including ACL, PCL, LCL, MCL. Negative Mcmurray's and provocative meniscal tests. Non painful patellar compression. Patellar and quadriceps tendons unremarkable. Hamstring and quadriceps strength is  normal.  Procedure: Real-time Ultrasound Guided aspiration/Injection of right knee Device: GE Logiq E  Verbal informed consent obtained.  Time-out conducted.  Noted no overlying erythema, induration, or other signs of local infection.  Skin prepped in a sterile fashion.  Local anesthesia: Topical Ethyl chloride.  With sterile technique and under real time ultrasound guidance:  Aspirated 65 mL straw-colored fluid, syringe switched and 1 mL Kenalog 40, 2 mL lidocaine, 2 mL bupivacaine injected easily. Completed without difficulty  Pain immediately resolved suggesting accurate placement of the medication.  Advised to call if fevers/chills, erythema, induration, drainage, or persistent bleeding.  Images permanently stored and available for review in the ultrasound unit.  Impression: Technically successful ultrasound guided injection.  Procedure: Real-time Ultrasound Guided aspiration/Injection of left knee Device: GE Logiq E  Verbal informed consent obtained.  Time-out conducted.  Noted no overlying erythema, induration, or other signs of local infection.  Skin prepped in a sterile fashion.  Local anesthesia: Topical Ethyl chloride.  With sterile technique and under real time ultrasound guidance:  Aspirated 22 mL straw-colored fluid, syringe switched and 1 mL Kenalog 40, 2 mL lidocaine, 2 mL bupivacaine injected easily. Completed without difficulty  Pain immediately resolved suggesting accurate placement of the medication.  Advised to call if fevers/chills, erythema, induration, drainage, or persistent bleeding.  Images permanently stored and available for review in the ultrasound unit.  Impression: Technically successful ultrasound guided injection.  Impression and Recommendations:    Primary osteoarthritis of both knees 3 months since previous procedure, bilateral aspiration and injection as above. Patient cannot afford Orthovisc. Switching to Celebrex, and he does need [facility] with [facility] arthroplasties. Hydrocodone tens are ineffective, increasing to oxycodone.

## 2016-08-03 ENCOUNTER — Other Ambulatory Visit: Payer: Self-pay

## 2016-08-03 ENCOUNTER — Telehealth: Payer: Self-pay

## 2016-08-03 DIAGNOSIS — M170 Bilateral primary osteoarthritis of knee: Secondary | ICD-10-CM

## 2016-08-03 MED ORDER — CELECOXIB 200 MG PO CAPS: ORAL_CAPSULE | ORAL | 2 refills | Status: DC

## 2016-08-03 NOTE — Telephone Encounter (Signed)
Pt called and said that celebrex is too expensive at [facility] he asked for it to be called to [facility].  Sent.

## 2016-08-29 ENCOUNTER — Encounter: Payer: Self-pay | Admitting: Emergency Medicine

## 2016-08-29 ENCOUNTER — Emergency Department
Admission: EM | Admit: 2016-08-29 | Discharge: 2016-08-29 | Disposition: A | Discharge status: Home / Self Care | Payer: Medicare Other | Source: Home / Self Care | Attending: Family Medicine | Admitting: Family Medicine

## 2016-08-29 DIAGNOSIS — S61451A Open bite of right hand, initial encounter: Secondary | ICD-10-CM

## 2016-08-29 DIAGNOSIS — W540XXA Bitten by dog, initial encounter: Secondary | ICD-10-CM

## 2016-08-29 DIAGNOSIS — L089 Local infection of the skin and subcutaneous tissue, unspecified: Secondary | ICD-10-CM | POA: Diagnosis not present

## 2016-08-29 MED ORDER — AMOXICILLIN-POT CLAVULANATE 875-125 MG PO TABS: 1.0000 | ORAL_TABLET | Freq: Two times a day (BID) | ORAL | 0 refills | Status: AC

## 2016-08-29 NOTE — ED Provider Notes (Signed)
CSN: [id number]     Arrival date & time 08/29/16  1418 History   First MD Initiated Contact with Patient 08/29/16 1435     Chief Complaint  Patient presents with  . Animal Bite   (Consider location/radiation/quality/duration/timing/severity/associated sxs/prior Treatment) HPI  Colton SalkKenneth Tarry is a 81 y.o. male presenting to UC with c/o dog bites to his Right hand. Pt was accidentally bit by his own dog that is UTD on his rabies vaccine.  Bleeding controlled PTA. Pt notes he came in this afternoon because he noticed a red streak going up his arm and is concerned for infection. Denies fever, chills, n/v/d. He is not on blood thinners. No other injuries.  Last Tdap: 01/11/16.   History reviewed. No pertinent past medical history. History reviewed. No pertinent surgical history. No family history on file. Social History  Substance Use Topics  . Smoking status: Never Smoker  . Smokeless tobacco: Never Used  . Alcohol use Not on file    Review of Systems  Constitutional: Negative for chills and fever.  Musculoskeletal: Negative for arthralgias, joint swelling and myalgias.  Skin: Positive for color change and wound. Negative for rash.  Neurological: Negative for weakness and numbness.    Allergies  Pollen extract  Home Medications   Prior to Admission medications   Medication Sig Start Date End Date Taking? Authorizing Provider  amoxicillin-clavulanate (AUGMENTIN) 875-125 MG tablet Take 1 tablet by mouth 2 (two) times daily. One po bid x 7 days 08/29/16   Junius Finner'Malley, Jalea Bronaugh, PA-C  celecoxib (CELEBREX) 200 MG capsule One to 2 tablets by mouth daily as needed for pain. 08/03/16   Monica Bectonhekkekandam, Thomas J, MD  levothyroxine (SYNTHROID, LEVOTHROID) 175 MCG tablet Take 175 mcg by mouth daily before breakfast.    [provider]  loratadine (CLARITIN) 10 MG tablet Take 10 mg by mouth daily.    [provider]  oxyCODONE-acetaminophen (PERCOCET/ROXICET) 5-325 MG tablet Take 1  tablet by mouth 2 (two) times daily as needed. 08/02/16   Monica Bectonhekkekandam, Thomas J, MD  pravastatin (PRAVACHOL) 40 MG tablet Take 40 mg by mouth daily.    [provider]  traZODone (DESYREL) 100 MG tablet Take 100 mg by mouth at bedtime.    [provider]  triamterene-hydrochlorothiazide (DYAZIDE) 37.5-25 MG per capsule Take 1 capsule by mouth every morning.    [provider]   Meds Ordered and Administered this Visit  Medications - No data to display  BP (!) 141/76 (BP Location: Left Arm)   Pulse 66   Temp 97.6 F (36.4 C) (Oral)   Ht 5\' 10"  (1.778 m)   Wt 223 lb (101.2 kg)   SpO2 95%   BMI 32.00 kg/m  No data found.   Physical Exam  Constitutional: He is oriented to person, place, and time. He appears well-developed and well-nourished. No distress.  HENT:  Head: Normocephalic and atraumatic.  Eyes: EOM are normal.  Neck: Normal range of motion.  Cardiovascular: Normal rate.   Pulmonary/Chest: Effort normal.  Musculoskeletal: Normal range of motion. He exhibits no edema or tenderness.  Right hand: no edema. Full ROM hand and wrist.  Neurological: He is alert and oriented to person, place, and time.  Skin: Skin is warm and dry. Capillary refill takes less than 2 seconds. He is not diaphoretic. There is erythema.  Right wrist and hand: puncture wound with scant tried blood on dorsal aspect of hand over 1st metacarpal.  Faint erythematous streak from dog bite to 3in up  radial aspect of arm. Non-tender.   Psychiatric: He has a normal mood and affect. His behavior is normal.  Nursing note and vitals reviewed.   Urgent Care Course     Procedures (including critical care time)  Labs Review Labs Reviewed - No data to display  Imaging Review No results found.  MDM   1. Dog bite of right hand with infection, initial encounter    Dog bite to Right hand appears to becoming infected.  Rx: Augmentin  Home care instructions provided. f/u with PCP  in 4-5 days if not improving, sooner if worsening.     Junius Finner, PA-C 08/29/16 1454

## 2016-08-29 NOTE — ED Triage Notes (Signed)
Right hand dog bite last night, his dog, up to date on shots.

## 2016-10-18 ENCOUNTER — Ambulatory Visit (INDEPENDENT_AMBULATORY_CARE_PROVIDER_SITE_OTHER): Payer: Medicare Other

## 2016-10-18 ENCOUNTER — Ambulatory Visit (INDEPENDENT_AMBULATORY_CARE_PROVIDER_SITE_OTHER): Payer: Medicare Other | Admitting: Sports Medicine

## 2016-10-18 DIAGNOSIS — M170 Bilateral primary osteoarthritis of knee: Secondary | ICD-10-CM | POA: Diagnosis not present

## 2016-10-18 DIAGNOSIS — M19072 Primary osteoarthritis, left ankle and foot: Secondary | ICD-10-CM | POA: Insufficient documentation

## 2016-10-18 DIAGNOSIS — M19071 Primary osteoarthritis, right ankle and foot: Secondary | ICD-10-CM | POA: Diagnosis not present

## 2016-10-18 DIAGNOSIS — M25572 Pain in left ankle and joints of left foot: Secondary | ICD-10-CM | POA: Diagnosis not present

## 2016-10-18 MED ORDER — OXYCODONE-ACETAMINOPHEN 5-325 MG PO TABS: 1.0000 | ORAL_TABLET | Freq: Two times a day (BID) | ORAL | 0 refills | Status: AC | PRN

## 2016-10-18 NOTE — Assessment & Plan Note (Addendum)
Patient can patient can return for injection in a month. Getting baseline x-rays.

## 2016-10-18 NOTE — Assessment & Plan Note (Signed)
Bilateral aspiration and injection, previous procedure was 3 months ago.

## 2016-10-18 NOTE — Progress Notes (Signed)
Subjective:    CC: Bilateral knee pain  HPI: This is a pleasant 81 year old male, he has known bilateral knee osteoarthritis, previous aspiration and injection was 3 months ago, he tells me it provided only 6 weeks of relief, now has a recurrence of pain and swelling, moderate, persistent without radiation.  Left ankle pain: Known osteoarthritis, desires injection here as well but understands he will have to wait until the next time for this.  Past medical history:  Negative.  See flowsheet/record as well for more information.  Surgical history: Negative.  See flowsheet/record as well for more information.  Family history: Negative.  See flowsheet/record as well for more information.  Social history: Negative.  See flowsheet/record as well for more information.  Allergies, and medications have been entered into the medical record, reviewed, and no changes needed.   Review of Systems: No fevers, chills, night sweats, weight loss, chest pain, or shortness of breath.   Objective:    General: Well Developed, well nourished, and in no acute distress.  Neuro: Alert and oriented x3, extra-ocular muscles intact, sensation grossly intact.  HEENT: Normocephalic, atraumatic, pupils equal round reactive to light, neck supple, no masses, no lymphadenopathy, thyroid nonpalpable.  Skin: Warm and dry, no rashes. Cardiac: Regular rate and rhythm, no murmurs rubs or gallops, no lower extremity edema.  Respiratory: Clear to auscultation bilaterally. Not using accessory muscles, speaking in full sentences. Left Ankle: No visible erythema or swelling. Range of motion is full in all directions. Strength is 5/5 in all directions. Stable lateral and medial ligaments; squeeze test and kleiger test unremarkable; Tender to palpation over the tibiotalar joint No pain at base of 5th MT; No tenderness over cuboid; No tenderness over N spot or navicular prominence No tenderness on posterior aspects of lateral  and medial malleolus No sign of peroneal tendon subluxations; Negative tarsal tunnel tinel's Able to walk 4 steps.  Procedure: Real-time Ultrasound Guided aspiration/injection of left knee Device: GE Logiq E  Verbal informed consent obtained.  Time-out conducted.  Noted no overlying erythema, induration, or other signs of local infection.  Skin prepped in a sterile fashion.  Local anesthesia: Topical Ethyl chloride.  With sterile technique and under real time ultrasound guidance:  Using 18-gauge needle aspirated 22 mL straw-colored fluid, syringe switched and 1 mL kenalog 40, 2 mL lidocaine, 2 mL bupivacaine injected easily. Right now Completed without difficulty  Pain immediately resolved suggesting accurate placement of the medication.  Advised to call if fevers/chills, erythema, induration, drainage, or persistent bleeding.  Images permanently stored and available for review in the ultrasound unit.  Impression: Technically successful ultrasound guided injection.  Procedure: Real-time Ultrasound Guided aspiration/injection of right knee Device: GE Logiq E  Verbal informed consent obtained.  Time-out conducted.  Noted no overlying erythema, induration, or other signs of local infection.  Skin prepped in a sterile fashion.  Local anesthesia: Topical Ethyl chloride.  With sterile technique and under real time ultrasound guidance:  Using 18-gauge needle aspirated 33 mL straw-colored fluid, syringe switched and 1 mL kenalog 40, 2 mL lidocaine, 2 mL bupivacaine injected easily. Right now Completed without difficulty  Pain immediately resolved suggesting accurate placement of the medication.  Advised to call if fevers/chills, erythema, induration, drainage, or persistent bleeding.  Images permanently stored and available for review in the ultrasound unit.  Impression: Technically successful ultrasound guided injection.  Impression and Recommendations:    Primary osteoarthritis of left  ankle Patient can patient can return for injection in a month.  Getting baseline x-rays.  Primary osteoarthritis of both knees Bilateral aspiration and injection, previous procedure was 3 months ago.

## 2017-01-08 ENCOUNTER — Other Ambulatory Visit: Payer: Self-pay | Admitting: Sports Medicine

## 2017-01-08 DIAGNOSIS — M170 Bilateral primary osteoarthritis of knee: Secondary | ICD-10-CM

## 2017-12-31 IMAGING — DX DG ANKLE COMPLETE 3+V*L*
3 series · 3 of 3 positions shown · non-contrast
Comparison: None.

CLINICAL DATA: Left ankle pain, no known injury, initial encounter

EXAM:
LEFT ANKLE COMPLETE - 3+ VIEW

[ankle ap]
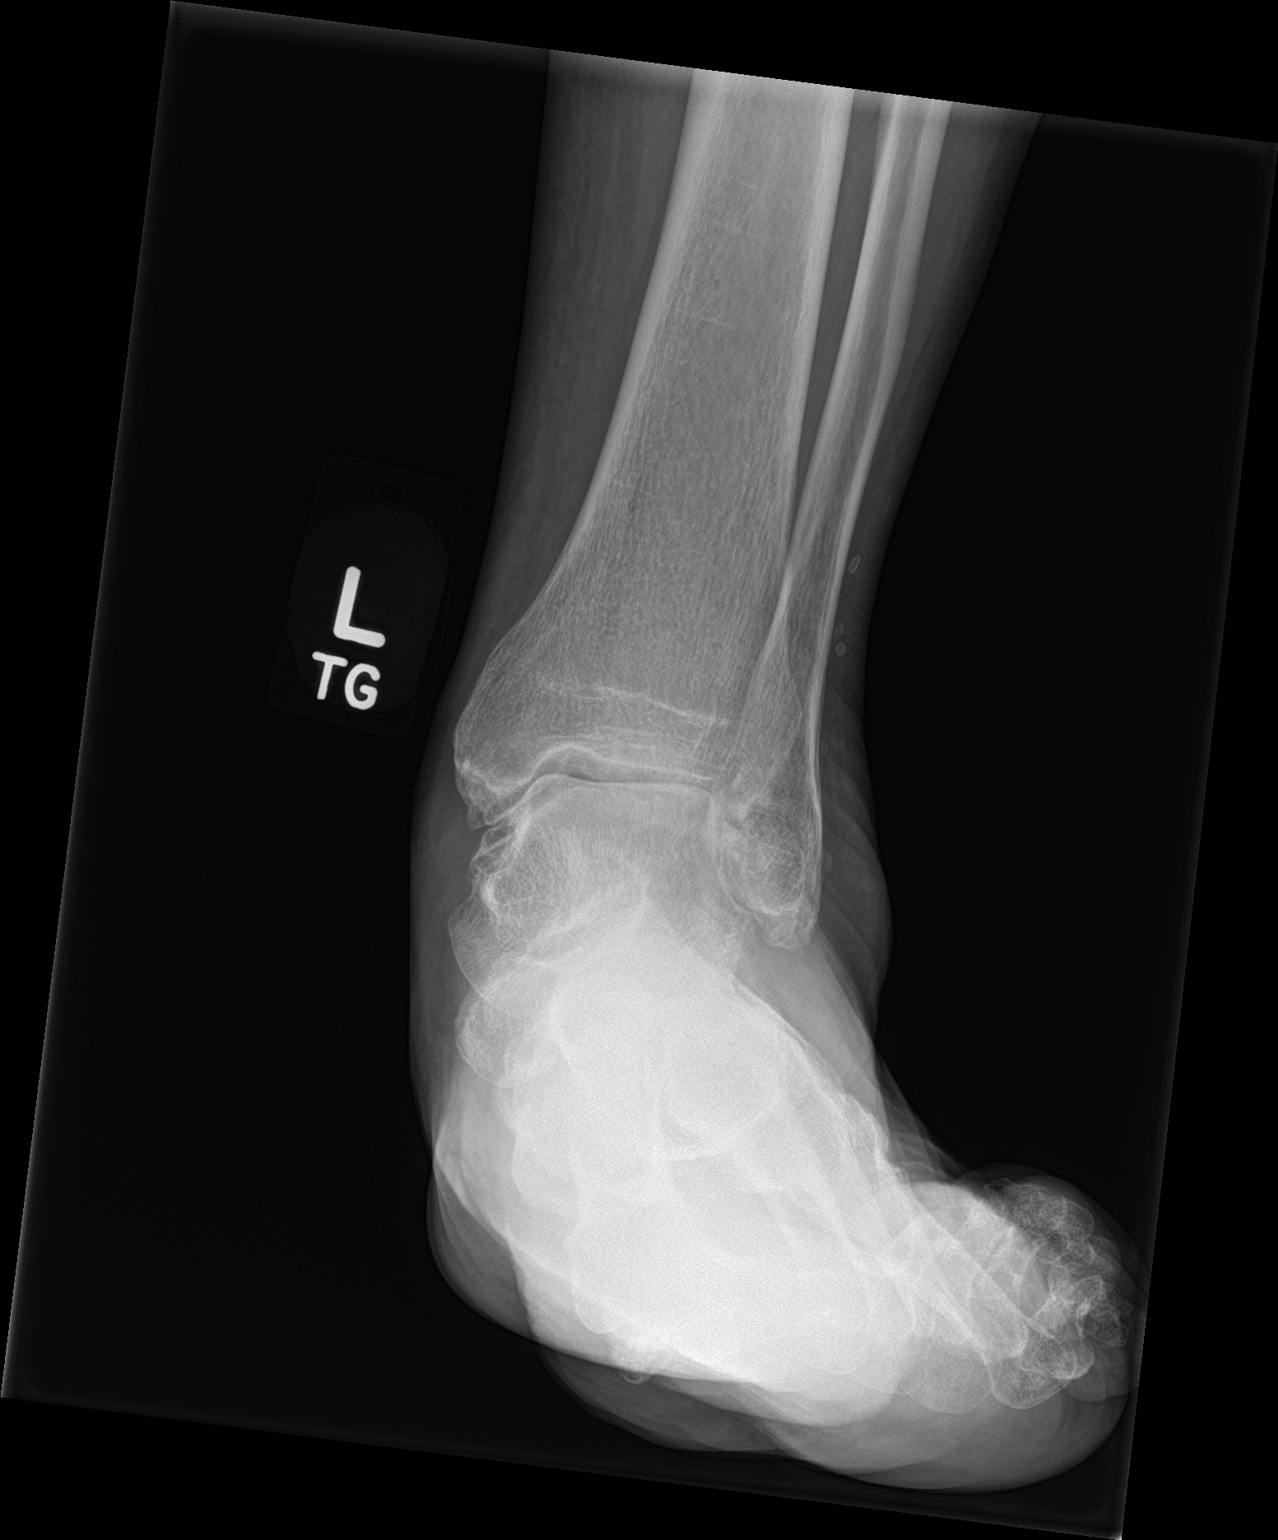

[ankle obl]
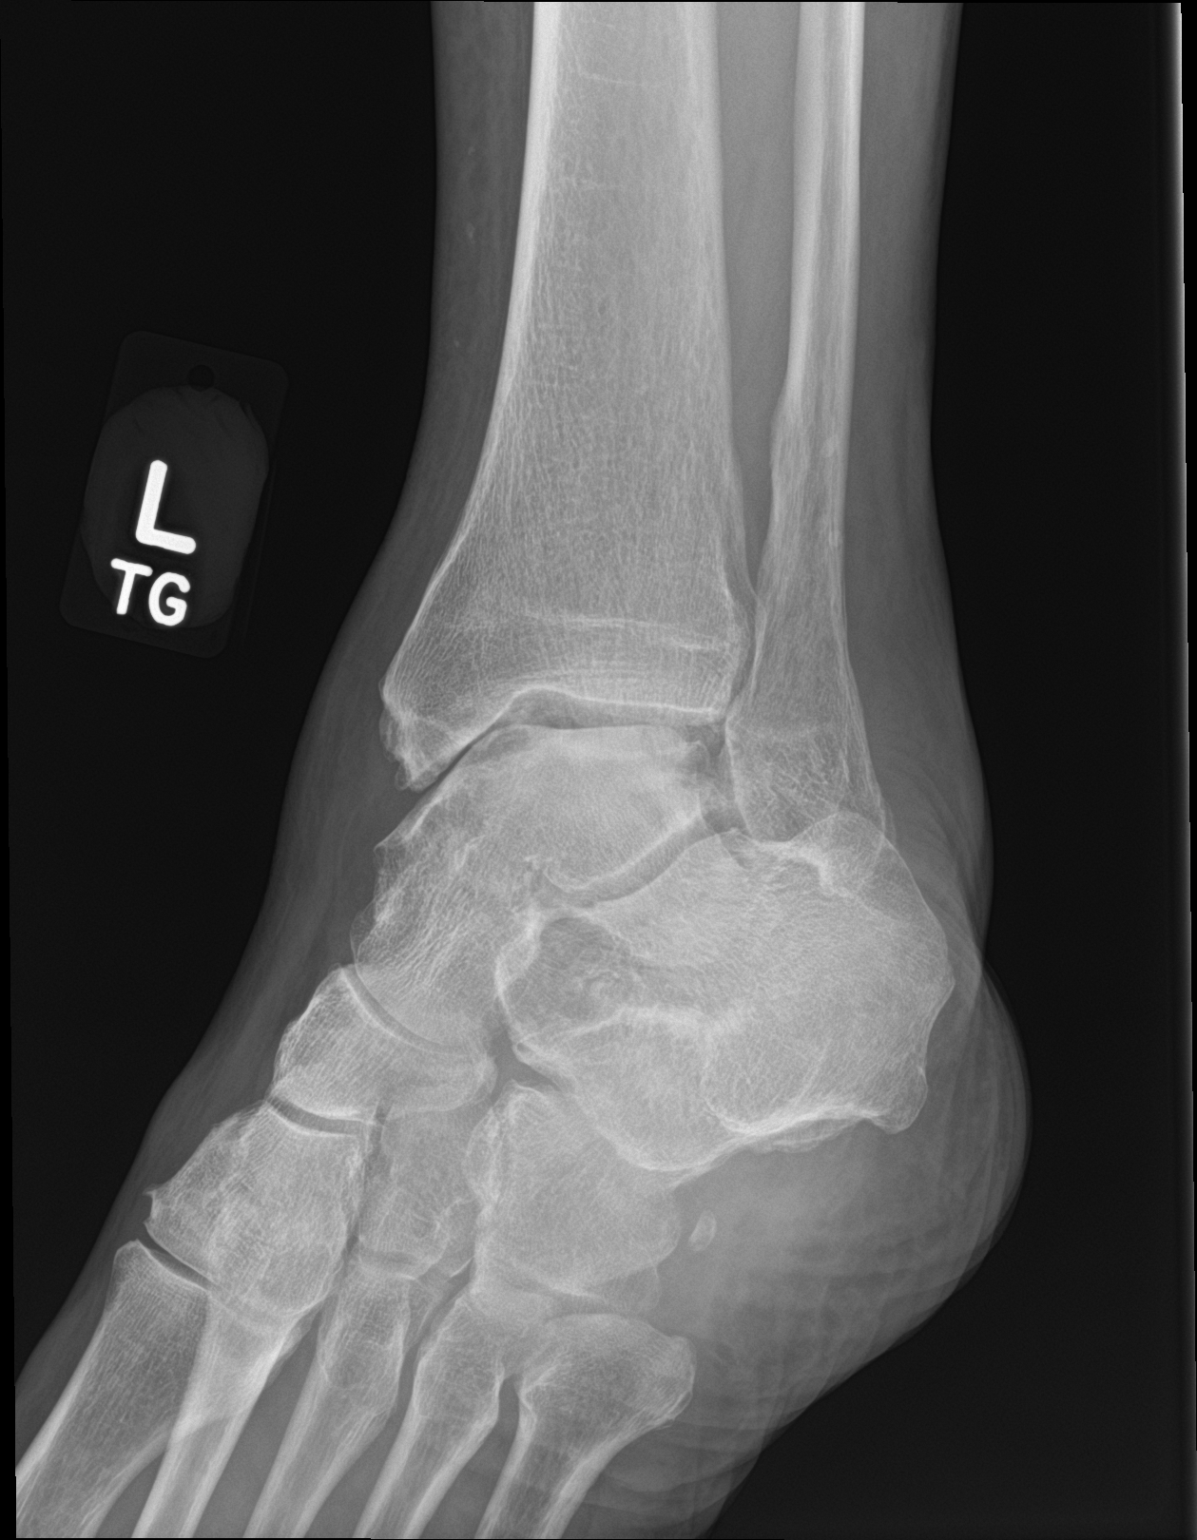

[ankle lat]
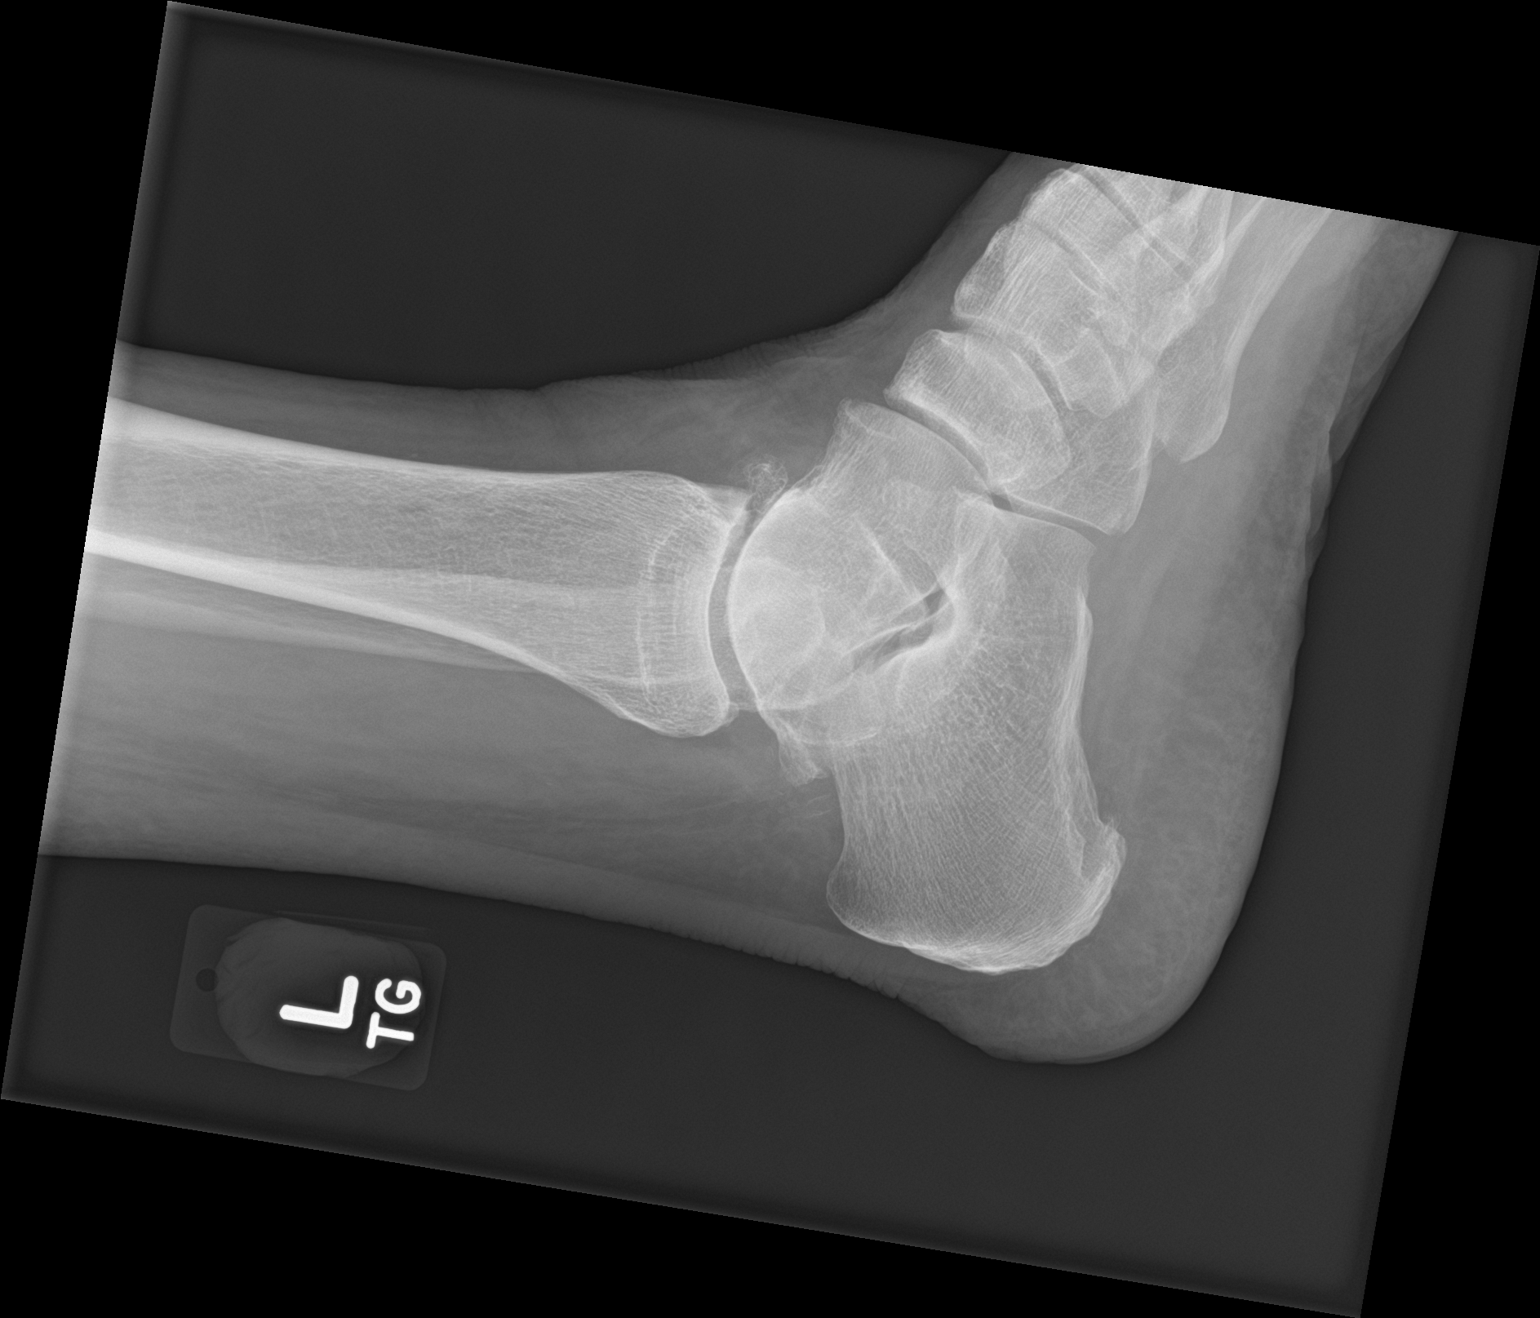

[3 of 3 positions shown; findings below may reference images not displayed]

FINDINGS: Mild degenerative changes of the tibiotalar articulation are seen.
No acute fracture or dislocation is noted. No soft tissue changes
are noted. Mild tarsal degenerative changes are seen as well.
IMPRESSION: Degenerative change without acute abnormality.
